# Patient Record
Sex: Female | Born: 1960 | Race: White | Hispanic: No | Marital: Married | Smoking: Never smoker
Health system: Southern US, Community
[De-identification: ages and names within clinical notes are randomized; demographics above are authoritative.]

## PROBLEM LIST (undated history)

## (undated) DIAGNOSIS — I10 Essential (primary) hypertension: Secondary | ICD-10-CM

## (undated) DIAGNOSIS — E785 Hyperlipidemia, unspecified: Secondary | ICD-10-CM

---

## 2010-01-01 ENCOUNTER — Ambulatory Visit: Payer: Self-pay | Admitting: Orthopedic Surgery

## 2010-01-01 DIAGNOSIS — M76829 Posterior tibial tendinitis, unspecified leg: Secondary | ICD-10-CM | POA: Insufficient documentation

## 2010-01-01 DIAGNOSIS — M5430 Sciatica, unspecified side: Secondary | ICD-10-CM | POA: Insufficient documentation

## 2010-01-01 DIAGNOSIS — M7710 Lateral epicondylitis, unspecified elbow: Secondary | ICD-10-CM | POA: Insufficient documentation

## 2010-01-01 DIAGNOSIS — Q762 Congenital spondylolisthesis: Secondary | ICD-10-CM | POA: Insufficient documentation

## 2010-01-01 DIAGNOSIS — M549 Dorsalgia, unspecified: Secondary | ICD-10-CM | POA: Insufficient documentation

## 2010-03-25 NOTE — Letter (Signed)
Summary: History form  History form   Imported By: Jacklynn Ganong 01/01/2010 11:45:55  _____________________________________________________________________  External Attachment:    Type:   Image     Comment:   External Document

## 2010-03-25 NOTE — Assessment & Plan Note (Signed)
Summary: RT ELBOW PAIN NEEDS XR/BCBS/BSF   Vital Signs:  Patient profile:   50 year old female Height:      65 inches Weight:      226 pounds Pulse rate:   68 / minute Resp:     16 per minute  Vitals Entered By: Fuller Canada MD (January 01, 2010 10:05 AM)  Visit Type:  new patient Referring Provider:  self Primary Provider:  Dr. Donzetta Sprung  CC:  right elbow.  History of Present Illness: I saw Paula Jones in the office today for an initial visit.  She is a 50 years old woman with the complaint of:  increasing pain in her lumbar spine x1 month, which radiates to her LEFT foot. She describes a sharp, stabbing severe, 7/10 pain, and the LEFT lower extremity, which is intermittent and worse in the morning. It seems to be improved by stretching, and ice it is incompletely relieved by Advil 600 mg at night. No specific worsening factors are noted.   Are there any red flags:  Numbness, tingling ? no  Bowel/bladder problem? no Saddle anesthesisa? no  Fever, Night sweats? no  Weakness? no   Xrays today in our office.  Meds: Losartan and Advil as needed.      Allergies (verified): No Known Drug Allergies  Past History:  Past Medical History: htn melanoma  Past Surgical History: removal of melanoma tonsils rt knee sark  Family History: FH of Cancer:  Family History of Arthritis Hx, family, asthma  Social History: Patient is married.  bakery no smoking no alcohol no caffeien 4 yr college degree  Review of Systems Musculoskeletal:  Complains of joint pain, swelling, and stiffness; denies instability, redness, heat, and muscle pain.  The review of systems is negative for Constitutional, Cardiovascular, Respiratory, Gastrointestinal, Genitourinary, Neurologic, Endocrine, Psychiatric, Skin, HEENT, Immunology, and Hemoatologic.  Physical Exam  Msk:  The patient is well developed and nourished, with normal grooming and hygiene. The body habitus is  large  Lumbar spine nontender. LEFT buttock area is tender.    Pulses:  pulses normal in all 4 extremities Extremities:  normal range of motion in the RIGHT elbow with positive wrist extension sign with the elbow extended.  RIGHT elbow. Stability is normal. Extension power flexion power are normal.  RIGHT foot. Tenderness over the posterior tibial tendon with normal range of motion at the ankle. No instability detected. Flattened arch noted.  Range of motion of the hips, normal. No tenderness in the hips. Hips are stable. Muscle strength and tone normal.   Neurologic:  no focal deficits, CN II-XII grossly intact with normal reflexes, coordination, muscle strength and tone  She does have a positive LEFT straight leg raise exacerbated with a positive Lasegue's sign. Negative cross leg Lasegue's sign. Skin:  intact without lesions or rashes  She is a RIGHT 4th toe, nail abnormality, as well as a LEFT great toenail and without abnormality, which looked like onychomycosis.   Inguinal Nodes:  no significant adenopathy Psych:  alert and cooperative; normal mood and affect; normal attention span and concentration   Impression & Recommendations:  Problem # 1:  BACK PAIN (ICD-724.5) Assessment New  AP, lateral, RIGHT elbow shows that there is some mild arthritis on the medial side of the elbow with an impinging area at the most medial margin of the joint. Overall, the alignment looks normal.  AP, lateral, and spot films lumbo-sacral spine shows that there is facet arthritis in the last vertebral segment,  L5, S1 and also at L4 and L5. Overall spinal alignment shows a small shift in the coronal plane with normal lordotic curve on the lateral view. There are some anterior marginal osteophytes seen at T12, L2 and there is a slight L4-L5 spondylolisthesis grade 1.  Orders: New Patient Level IV (04540)  Problem # 2:  SCIATICA (ICD-724.3) Assessment: New  Orders: New Patient Level IV  (98119)  Problem # 3:  SPONDYLOLITHESIS (JYN-829.56) Assessment: New  Orders: New Patient Level IV (21308) Lumbosacral Spine ,2/3 views (72100)  Problem # 4:  LATERAL EPICONDYLITIS (ICD-726.32) Assessment: New  AP lateral x-rays of the elbow, show mild arthritis. Clinical exam shows tendinitis, consistent with lateral epicondylitis recommend elbow brace, and exercises  Orders: New Patient Level IV (65784) Elbow x-ray, 2 views (69629)  Problem # 5:  TIBIALIS TENDINITIS (ICD-726.72) Assessment: New  grade 1 posterior tibial tendon dysfunction, recommend over-the-counter Warm'N'Form orthotics.  Orders: New Patient Level IV (52841)  Medications Added to Medication List This Visit: 1)  Prednisone (pak) 10 Mg Tabs (Prednisone) .... As directed  Patient Instructions: 1)  After dose pack  2)  Start Ibuprofen 600mg  three times a day for 4 weeks  3)  Most patients (90%) of patients with low back pain will improve with time (2-6 weeks). Limit activity to comfort and avoid activities that increase discomfort.  Apply moist heat and/or ice to lower back and take medication as instructed for pain relief. Please read the Back Pain Handout. Avoid bed rest > 48hrs.  4)  DX: arthritis of the back with sciatica  Prescriptions: PREDNISONE (PAK) 10 MG TABS (PREDNISONE) as directed  #1 x 1   Entered and Authorized by:   Fuller Canada MD   Signed by:   Fuller Canada MD on 01/01/2010   Method used:   Print then Give to Patient   RxID:   [id number]    Orders Added: 1)  New Patient Level IV [03474] 2)  Lumbosacral Spine ,2/3 views [72100] 3)  Elbow x-ray, 2 views [73070]

## 2010-03-25 NOTE — Medication Information (Signed)
Summary: Tax adviser   Imported By: Cammie Sickle 01/07/2010 18:49:19  _____________________________________________________________________  External Attachment:    Type:   Image     Comment:   External Document

## 2010-09-17 ENCOUNTER — Emergency Department (HOSPITAL_COMMUNITY): Payer: No Typology Code available for payment source

## 2010-09-17 ENCOUNTER — Emergency Department (HOSPITAL_COMMUNITY)
Admission: EM | Admit: 2010-09-17 | Discharge: 2010-09-18 | Disposition: A | Discharge status: Home / Self Care | Payer: No Typology Code available for payment source | Attending: Emergency Medicine | Admitting: Emergency Medicine

## 2010-09-17 DIAGNOSIS — R51 Headache: Secondary | ICD-10-CM | POA: Insufficient documentation

## 2010-09-17 DIAGNOSIS — I10 Essential (primary) hypertension: Secondary | ICD-10-CM | POA: Insufficient documentation

## 2010-09-17 DIAGNOSIS — M542 Cervicalgia: Secondary | ICD-10-CM | POA: Insufficient documentation

## 2012-03-14 ENCOUNTER — Emergency Department (HOSPITAL_COMMUNITY): Payer: BC Managed Care – PPO

## 2012-03-14 ENCOUNTER — Encounter (HOSPITAL_COMMUNITY): Payer: Self-pay | Admitting: Family Medicine

## 2012-03-14 ENCOUNTER — Emergency Department (HOSPITAL_COMMUNITY)
Admission: EM | Admit: 2012-03-14 | Discharge: 2012-03-14 | Disposition: A | Discharge status: Home / Self Care | Payer: BC Managed Care – PPO | Attending: Emergency Medicine | Admitting: Emergency Medicine

## 2012-03-14 DIAGNOSIS — S6980XA Other specified injuries of unspecified wrist, hand and finger(s), initial encounter: Secondary | ICD-10-CM | POA: Insufficient documentation

## 2012-03-14 DIAGNOSIS — Y939 Activity, unspecified: Secondary | ICD-10-CM | POA: Insufficient documentation

## 2012-03-14 DIAGNOSIS — Z79899 Other long term (current) drug therapy: Secondary | ICD-10-CM | POA: Insufficient documentation

## 2012-03-14 DIAGNOSIS — F411 Generalized anxiety disorder: Secondary | ICD-10-CM | POA: Insufficient documentation

## 2012-03-14 DIAGNOSIS — I10 Essential (primary) hypertension: Secondary | ICD-10-CM | POA: Insufficient documentation

## 2012-03-14 DIAGNOSIS — S6990XA Unspecified injury of unspecified wrist, hand and finger(s), initial encounter: Secondary | ICD-10-CM | POA: Insufficient documentation

## 2012-03-14 DIAGNOSIS — S60229A Contusion of unspecified hand, initial encounter: Secondary | ICD-10-CM

## 2012-03-14 DIAGNOSIS — S2239XA Fracture of one rib, unspecified side, initial encounter for closed fracture: Secondary | ICD-10-CM

## 2012-03-14 DIAGNOSIS — S2249XA Multiple fractures of ribs, unspecified side, initial encounter for closed fracture: Secondary | ICD-10-CM

## 2012-03-14 DIAGNOSIS — Y92410 Unspecified street and highway as the place of occurrence of the external cause: Secondary | ICD-10-CM | POA: Insufficient documentation

## 2012-03-14 HISTORY — DX: Essential (primary) hypertension: I10

## 2012-03-14 MED ORDER — OXYCODONE-ACETAMINOPHEN 5-325 MG PO TABS: 2.0000 | ORAL_TABLET | ORAL | Status: AC | PRN

## 2012-03-14 MED ORDER — IBUPROFEN 600 MG PO TABS: 600.0000 mg | ORAL_TABLET | Freq: Four times a day (QID) | ORAL | Status: AC | PRN

## 2012-03-14 MED ORDER — IBUPROFEN 400 MG PO TABS
600.0000 mg | ORAL_TABLET | Freq: Once | ORAL | Status: AC
  Administered 2012-03-14: 600 mg via ORAL
  Filled 2012-03-14: qty 2

## 2012-03-14 NOTE — ED Notes (Signed)
Patient transported to X-ray 
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
## 2012-03-14 NOTE — ED Notes (Signed)
NAD noted at time of d/c. Pt verbalized and demonstrated use of IS.
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
## 2018-03-09 DIAGNOSIS — G6289 Other specified polyneuropathies: Secondary | ICD-10-CM | POA: Diagnosis not present

## 2018-03-09 DIAGNOSIS — E782 Mixed hyperlipidemia: Secondary | ICD-10-CM | POA: Diagnosis not present

## 2018-03-09 DIAGNOSIS — J4520 Mild intermittent asthma, uncomplicated: Secondary | ICD-10-CM | POA: Diagnosis not present

## 2018-03-09 DIAGNOSIS — I10 Essential (primary) hypertension: Secondary | ICD-10-CM | POA: Diagnosis not present

## 2018-08-29 DIAGNOSIS — Z01419 Encounter for gynecological examination (general) (routine) without abnormal findings: Secondary | ICD-10-CM | POA: Diagnosis not present

## 2018-09-02 DIAGNOSIS — I10 Essential (primary) hypertension: Secondary | ICD-10-CM | POA: Diagnosis not present

## 2018-09-02 DIAGNOSIS — Z23 Encounter for immunization: Secondary | ICD-10-CM | POA: Diagnosis not present

## 2018-09-02 DIAGNOSIS — E782 Mixed hyperlipidemia: Secondary | ICD-10-CM | POA: Diagnosis not present

## 2018-09-02 DIAGNOSIS — J4520 Mild intermittent asthma, uncomplicated: Secondary | ICD-10-CM | POA: Diagnosis not present

## 2018-09-02 DIAGNOSIS — G6289 Other specified polyneuropathies: Secondary | ICD-10-CM | POA: Diagnosis not present

## 2018-09-19 DIAGNOSIS — Z01419 Encounter for gynecological examination (general) (routine) without abnormal findings: Secondary | ICD-10-CM | POA: Diagnosis not present

## 2018-09-19 DIAGNOSIS — Z01411 Encounter for gynecological examination (general) (routine) with abnormal findings: Secondary | ICD-10-CM | POA: Diagnosis not present

## 2018-10-18 DIAGNOSIS — R946 Abnormal results of thyroid function studies: Secondary | ICD-10-CM | POA: Diagnosis not present

## 2018-10-20 DIAGNOSIS — Z6837 Body mass index (BMI) 37.0-37.9, adult: Secondary | ICD-10-CM | POA: Diagnosis not present

## 2018-10-20 DIAGNOSIS — L509 Urticaria, unspecified: Secondary | ICD-10-CM | POA: Diagnosis not present

## 2018-10-20 DIAGNOSIS — G4710 Hypersomnia, unspecified: Secondary | ICD-10-CM | POA: Diagnosis not present

## 2018-10-27 DIAGNOSIS — Z1231 Encounter for screening mammogram for malignant neoplasm of breast: Secondary | ICD-10-CM | POA: Diagnosis not present

## 2019-09-04 ENCOUNTER — Other Ambulatory Visit (HOSPITAL_COMMUNITY): Payer: Self-pay | Admitting: Family Medicine

## 2019-09-04 DIAGNOSIS — Z1231 Encounter for screening mammogram for malignant neoplasm of breast: Secondary | ICD-10-CM

## 2019-11-03 ENCOUNTER — Ambulatory Visit (HOSPITAL_COMMUNITY): Payer: Self-pay

## 2019-11-13 ENCOUNTER — Ambulatory Visit (HOSPITAL_COMMUNITY)
Admission: RE | Admit: 2019-11-13 | Discharge: 2019-11-13 | Disposition: A | Discharge status: Home / Self Care | Payer: 59 | Source: Ambulatory Visit | Attending: Family Medicine | Admitting: Family Medicine

## 2019-11-13 ENCOUNTER — Other Ambulatory Visit: Payer: Self-pay

## 2019-11-13 DIAGNOSIS — Z1231 Encounter for screening mammogram for malignant neoplasm of breast: Secondary | ICD-10-CM | POA: Diagnosis present

## 2020-09-19 ENCOUNTER — Other Ambulatory Visit (HOSPITAL_COMMUNITY): Payer: Self-pay | Admitting: Family Medicine

## 2020-09-19 DIAGNOSIS — Z1231 Encounter for screening mammogram for malignant neoplasm of breast: Secondary | ICD-10-CM

## 2020-11-14 ENCOUNTER — Ambulatory Visit (HOSPITAL_COMMUNITY): Payer: 59

## 2020-12-02 ENCOUNTER — Inpatient Hospital Stay (HOSPITAL_COMMUNITY): Admission: RE | Admit: 2020-12-02 | Payer: 59 | Source: Ambulatory Visit

## 2021-01-31 ENCOUNTER — Ambulatory Visit (HOSPITAL_COMMUNITY)
Admission: RE | Admit: 2021-01-31 | Discharge: 2021-01-31 | Disposition: A | Discharge status: Home / Self Care | Payer: 59 | Source: Ambulatory Visit | Attending: Family Medicine | Admitting: Family Medicine

## 2021-01-31 ENCOUNTER — Other Ambulatory Visit: Payer: Self-pay

## 2021-01-31 DIAGNOSIS — Z1231 Encounter for screening mammogram for malignant neoplasm of breast: Secondary | ICD-10-CM | POA: Diagnosis present

## 2021-04-10 ENCOUNTER — Encounter (INDEPENDENT_AMBULATORY_CARE_PROVIDER_SITE_OTHER): Payer: Self-pay | Admitting: *Deleted

## 2021-04-24 ENCOUNTER — Other Ambulatory Visit: Payer: Self-pay | Admitting: *Deleted

## 2021-04-24 DIAGNOSIS — Z800 Family history of malignant neoplasm of digestive organs: Secondary | ICD-10-CM

## 2021-04-24 DIAGNOSIS — Z1211 Encounter for screening for malignant neoplasm of colon: Secondary | ICD-10-CM

## 2021-04-30 ENCOUNTER — Ambulatory Visit: Payer: 59 | Admitting: General Surgery

## 2021-05-20 ENCOUNTER — Encounter (INDEPENDENT_AMBULATORY_CARE_PROVIDER_SITE_OTHER): Payer: Self-pay

## 2021-06-19 ENCOUNTER — Telehealth (INDEPENDENT_AMBULATORY_CARE_PROVIDER_SITE_OTHER): Payer: Self-pay

## 2021-06-19 NOTE — Telephone Encounter (Signed)
Referring MD/PCP: Quillian Quince ? ?Procedure: Tcs ? ?Reason/Indication:  Fam hx of colon ca ? ?Has patient had this procedure before?  yes ? If so, when, by whom and where?  12/2015 ? ?Is there a family history of colon cancer?  no ? Who?  What age when diagnosed?   ? ?Is patient diabetic? If yes, Type 1 or Type 2   no ?     ?Does patient have prosthetic heart valve or mechanical valve?  no ? ?Do you have a pacemaker/defibrillator?  no ? ?Has patient ever had endocarditis/atrial fibrillation? no ? ?Does patient use oxygen? no ? ?Has patient had joint replacement within last 12 months?  no ? ?Is patient constipated or do they take laxatives? no ? ?Does patient have a history of alcohol/drug use?  no ? ?Have you had a stroke/heart attack last 6 mths? no ? ?Do you take medicine for weight loss?  no ? ?For female patients,: have you had a hysterectomy no ?                     are you post menopausal yes ?                     do you still have your menstrual cycle no ? ?Is patient on blood thinner such as Coumadin, Plavix and/or Aspirin? no ? ?Medications: atorvastatin 10 mg daily, losartan 50 mg daily ? ?Allergies: nkda ? ?Medication Adjustment per Dr Jenetta Downer none ? ?Procedure date & time:  ? ? ?

## 2021-07-01 ENCOUNTER — Other Ambulatory Visit (INDEPENDENT_AMBULATORY_CARE_PROVIDER_SITE_OTHER): Payer: Self-pay

## 2021-07-01 DIAGNOSIS — Z800 Family history of malignant neoplasm of digestive organs: Secondary | ICD-10-CM

## 2021-07-01 DIAGNOSIS — Z1211 Encounter for screening for malignant neoplasm of colon: Secondary | ICD-10-CM

## 2021-07-02 ENCOUNTER — Telehealth (INDEPENDENT_AMBULATORY_CARE_PROVIDER_SITE_OTHER): Payer: Self-pay

## 2021-07-02 MED ORDER — SUTAB 1479-225-188 MG PO TABS: 1.0000 | ORAL_TABLET | Freq: Once | ORAL | 0 refills | Status: AC

## 2021-07-02 NOTE — Telephone Encounter (Signed)
Kelleen Stolze Ann Orbin Mayeux, CMA  ?

## 2021-07-03 ENCOUNTER — Encounter (INDEPENDENT_AMBULATORY_CARE_PROVIDER_SITE_OTHER): Payer: Self-pay

## 2021-07-11 ENCOUNTER — Encounter (HOSPITAL_COMMUNITY): Admission: RE | Admit: 2021-07-11 | Payer: 59 | Source: Ambulatory Visit

## 2021-08-13 ENCOUNTER — Encounter (HOSPITAL_COMMUNITY): Payer: Self-pay

## 2021-08-13 ENCOUNTER — Encounter (HOSPITAL_COMMUNITY)
Admission: RE | Admit: 2021-08-13 | Discharge: 2021-08-13 | Disposition: A | Discharge status: Home / Self Care | Payer: 59 | Source: Ambulatory Visit | Attending: Gastroenterology | Admitting: Gastroenterology

## 2021-08-13 HISTORY — DX: Hyperlipidemia, unspecified: E78.5

## 2021-08-15 ENCOUNTER — Ambulatory Visit (HOSPITAL_COMMUNITY): Payer: 59 | Admitting: Anesthesiology

## 2021-08-15 ENCOUNTER — Encounter (HOSPITAL_COMMUNITY)
Admission: RE | Disposition: A | Discharge status: Home / Self Care | Payer: Self-pay | Source: Ambulatory Visit | Attending: Gastroenterology

## 2021-08-15 ENCOUNTER — Ambulatory Visit (HOSPITAL_COMMUNITY)
Admission: RE | Admit: 2021-08-15 | Discharge: 2021-08-15 | Disposition: A | Discharge status: Home / Self Care | Payer: 59 | Source: Ambulatory Visit | Attending: Gastroenterology | Admitting: Gastroenterology

## 2021-08-15 ENCOUNTER — Ambulatory Visit (HOSPITAL_BASED_OUTPATIENT_CLINIC_OR_DEPARTMENT_OTHER): Payer: 59 | Admitting: Anesthesiology

## 2021-08-15 DIAGNOSIS — D120 Benign neoplasm of cecum: Secondary | ICD-10-CM | POA: Insufficient documentation

## 2021-08-15 DIAGNOSIS — I10 Essential (primary) hypertension: Secondary | ICD-10-CM | POA: Diagnosis not present

## 2021-08-15 DIAGNOSIS — Z800 Family history of malignant neoplasm of digestive organs: Secondary | ICD-10-CM | POA: Insufficient documentation

## 2021-08-15 DIAGNOSIS — K648 Other hemorrhoids: Secondary | ICD-10-CM | POA: Diagnosis not present

## 2021-08-15 DIAGNOSIS — K635 Polyp of colon: Secondary | ICD-10-CM | POA: Diagnosis not present

## 2021-08-15 DIAGNOSIS — Z1211 Encounter for screening for malignant neoplasm of colon: Secondary | ICD-10-CM | POA: Insufficient documentation

## 2021-08-15 DIAGNOSIS — E785 Hyperlipidemia, unspecified: Secondary | ICD-10-CM | POA: Diagnosis not present

## 2021-08-15 HISTORY — PX: COLONOSCOPY WITH PROPOFOL: SHX5780

## 2021-08-15 HISTORY — PX: POLYPECTOMY: SHX5525

## 2021-08-15 LAB — HM COLONOSCOPY

## 2021-08-15 SURGERY — COLONOSCOPY WITH PROPOFOL
Anesthesia: General

## 2021-08-15 MED ORDER — LIDOCAINE 2% (20 MG/ML) 5 ML SYRINGE
INTRAMUSCULAR | Status: DC | PRN
  Administered 2021-08-15: 60 mg via INTRAVENOUS

## 2021-08-15 MED ORDER — STERILE WATER FOR IRRIGATION IR SOLN
Status: DC | PRN
  Administered 2021-08-15: 50 mL

## 2021-08-15 MED ORDER — LACTATED RINGERS IV SOLN: INTRAVENOUS | Status: DC

## 2021-08-15 MED ORDER — PROPOFOL 10 MG/ML IV BOLUS
INTRAVENOUS | Status: DC | PRN
  Administered 2021-08-15: 100 mg via INTRAVENOUS
  Administered 2021-08-15: 30 mg via INTRAVENOUS

## 2021-08-15 MED ORDER — PROPOFOL 500 MG/50ML IV EMUL
INTRAVENOUS | Status: DC | PRN
  Administered 2021-08-15: 100 ug/kg/min via INTRAVENOUS

## 2021-08-15 NOTE — Anesthesia Preprocedure Evaluation (Addendum)
Anesthesia Evaluation  Patient identified by MRN, date of birth, ID band Patient awake    Reviewed: Allergy & Precautions, NPO status , Patient's Chart, lab work & pertinent test results  Airway Mallampati: II  TM Distance: >3 FB Neck ROM: Full    Dental  (+) Dental Advisory Given, Teeth Intact   Pulmonary neg pulmonary ROS,    Pulmonary exam normal breath sounds clear to auscultation       Cardiovascular Exercise Tolerance: Good hypertension, Pt. on medications Normal cardiovascular exam Rhythm:Regular Rate:Normal     Neuro/Psych  Neuromuscular disease negative psych ROS   GI/Hepatic negative GI ROS, Neg liver ROS,   Endo/Other  negative endocrine ROS  Renal/GU negative Renal ROS  negative genitourinary   Musculoskeletal  (+) Arthritis , Osteoarthritis,    Abdominal   Peds negative pediatric ROS (+)  Hematology negative hematology ROS (+)   Anesthesia Other Findings   Reproductive/Obstetrics negative OB ROS                            Anesthesia Physical Anesthesia Plan  ASA: 2  Anesthesia Plan: General   Post-op Pain Management: Minimal or no pain anticipated   Induction: Intravenous  PONV Risk Score and Plan: Propofol infusion  Airway Management Planned: Nasal Cannula and Natural Airway  Additional Equipment:   Intra-op Plan:   Post-operative Plan:   Informed Consent: I have reviewed the patients History and Physical, chart, labs and discussed the procedure including the risks, benefits and alternatives for the proposed anesthesia with the patient or authorized representative who has indicated his/her understanding and acceptance.     Dental advisory given  Plan Discussed with: CRNA and Surgeon  Anesthesia Plan Comments:         Anesthesia Quick Evaluation

## 2021-08-18 ENCOUNTER — Encounter (INDEPENDENT_AMBULATORY_CARE_PROVIDER_SITE_OTHER): Payer: Self-pay | Admitting: *Deleted

## 2021-08-18 LAB — SURGICAL PATHOLOGY

## 2021-08-21 ENCOUNTER — Encounter (HOSPITAL_COMMUNITY): Payer: Self-pay | Admitting: Gastroenterology

## 2021-10-08 ENCOUNTER — Other Ambulatory Visit (HOSPITAL_COMMUNITY): Payer: Self-pay | Admitting: Family Medicine

## 2021-10-08 DIAGNOSIS — Z1231 Encounter for screening mammogram for malignant neoplasm of breast: Secondary | ICD-10-CM

## 2022-05-11 ENCOUNTER — Ambulatory Visit (HOSPITAL_COMMUNITY)
Admission: RE | Admit: 2022-05-11 | Discharge: 2022-05-11 | Disposition: A | Discharge status: Home / Self Care | Payer: 59 | Source: Ambulatory Visit | Attending: Family Medicine | Admitting: Family Medicine

## 2022-05-11 DIAGNOSIS — Z1231 Encounter for screening mammogram for malignant neoplasm of breast: Secondary | ICD-10-CM | POA: Diagnosis present

## 2023-01-24 IMAGING — MG MM DIGITAL SCREENING BILAT W/ TOMO AND CAD
8 series · 8 of 24 positions shown · non-contrast
Comparison: Previous exam(s).

CLINICAL DATA: Screening.

EXAM:
DIGITAL SCREENING BILATERAL MAMMOGRAM WITH TOMOSYNTHESIS AND CAD
TECHNIQUE: Bilateral screening digital craniocaudal and mediolateral oblique
mammograms were obtained. Bilateral screening digital breast
tomosynthesis was performed. The images were evaluated with
computer-aided detection.

[R MLO synth-2D]
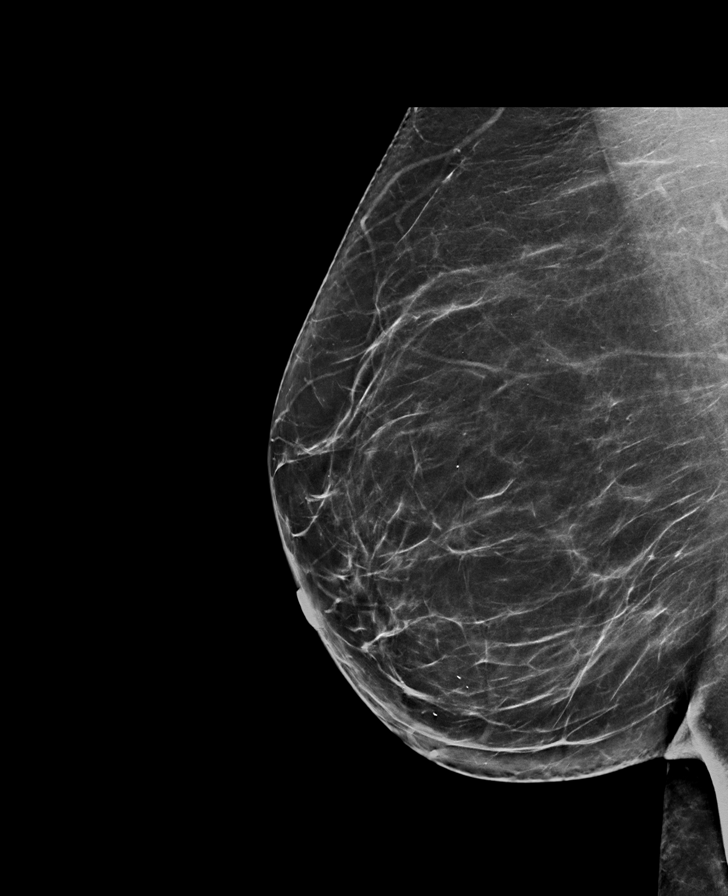

[R CC synth-2D]
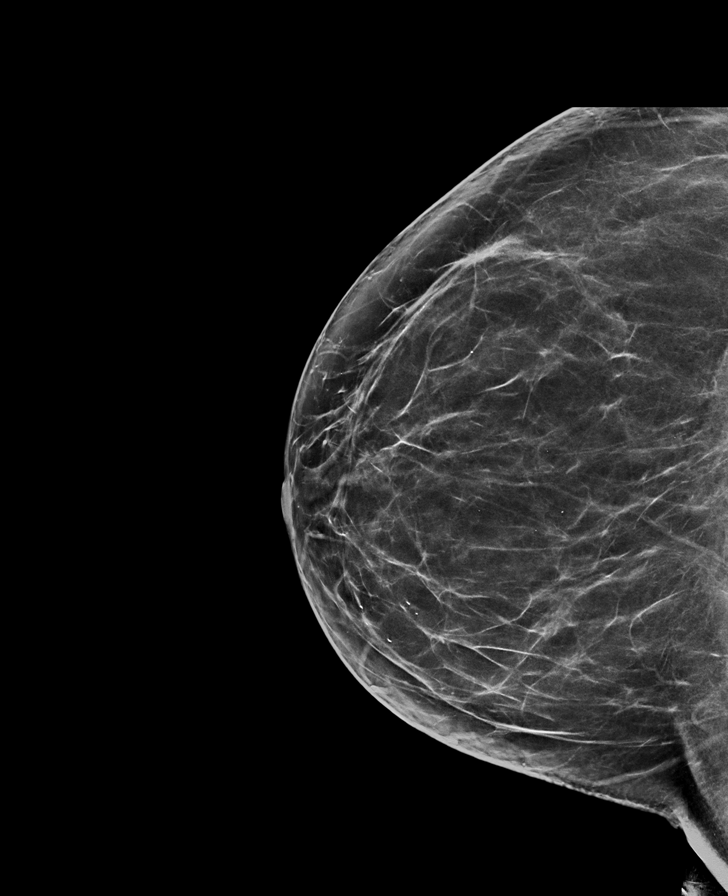

[L MLO synth-2D]
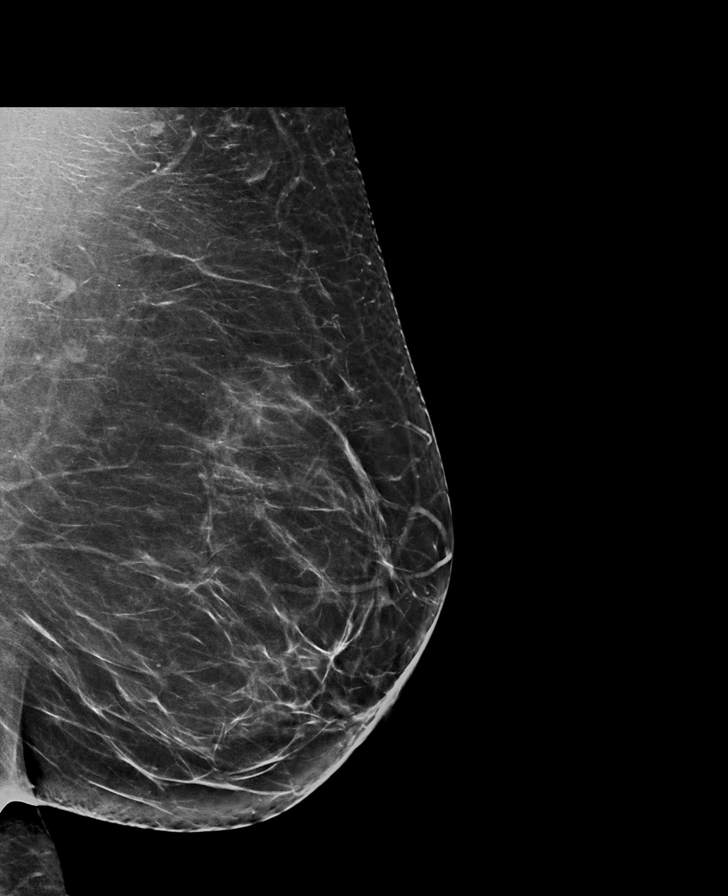

[L CC synth-2D]
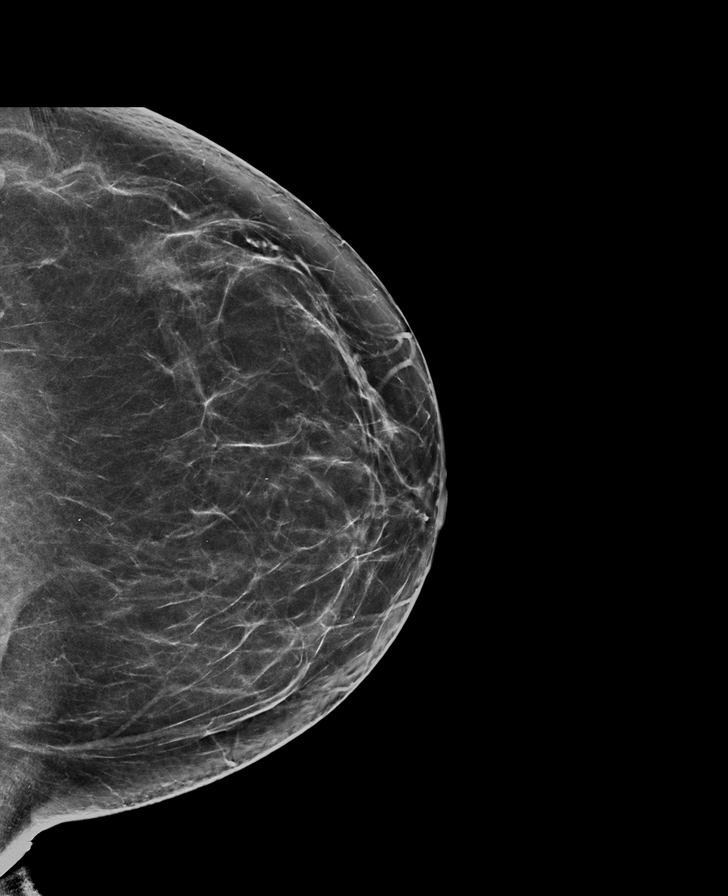

[R MLO tomo · tomo slice 41/80.0]
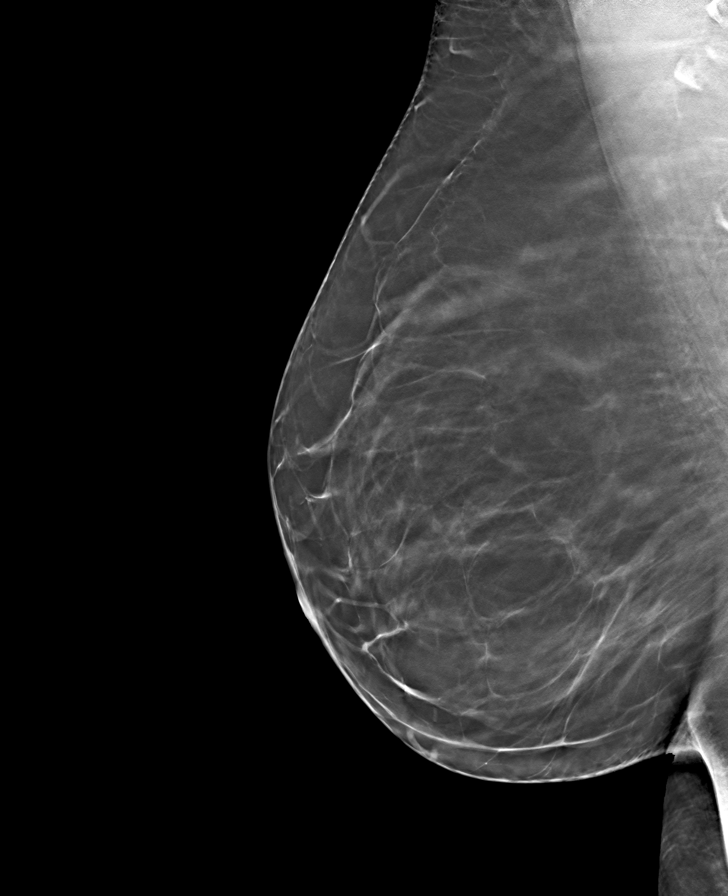

[L CC tomo · tomo slice 41/81.0]
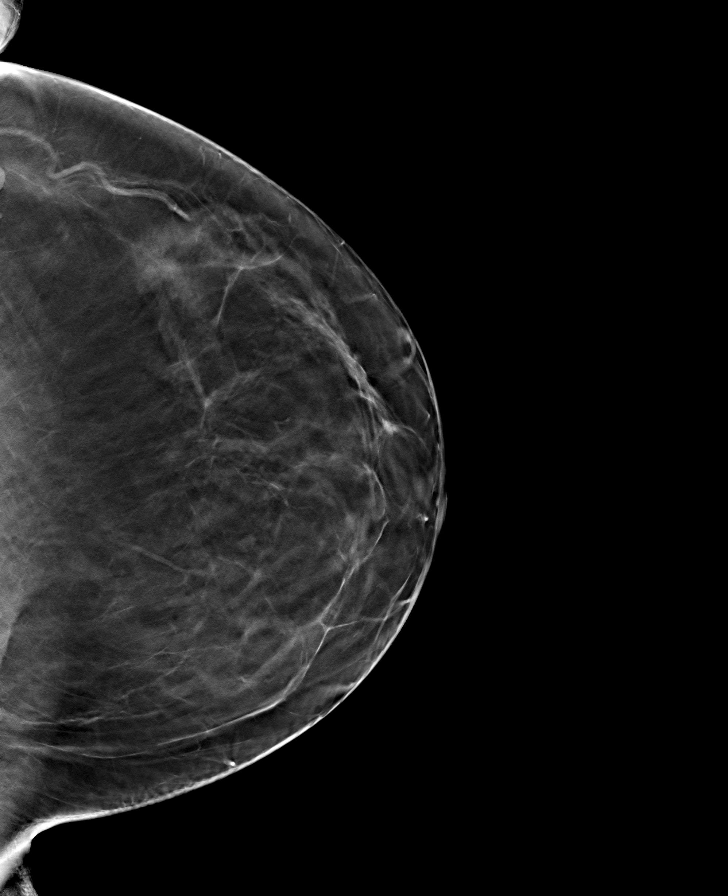

[R CC tomo · tomo slice 40/79.0]
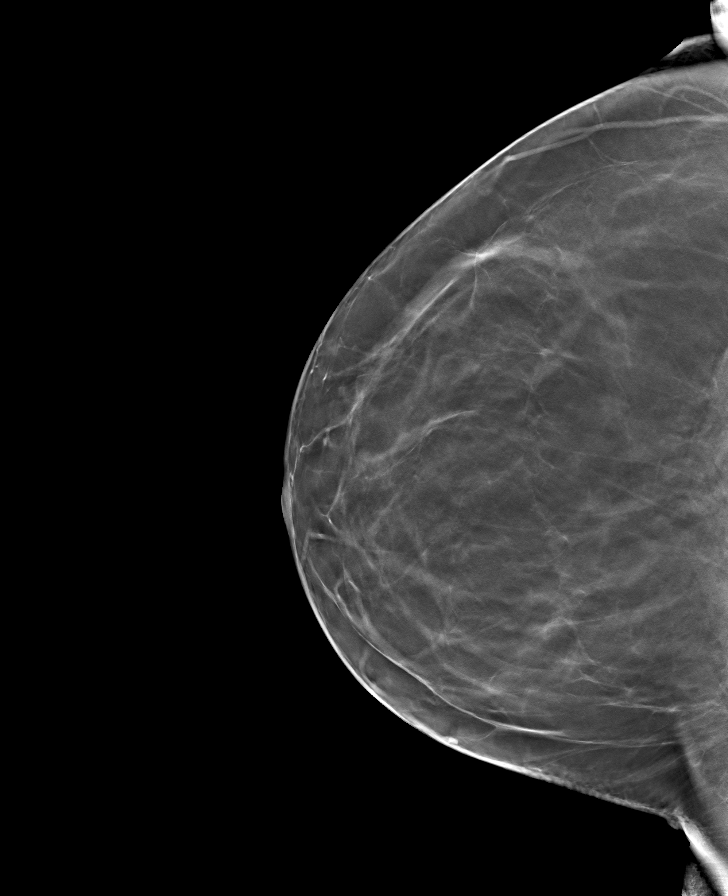

[L MLO tomo · tomo slice 43/85.0]
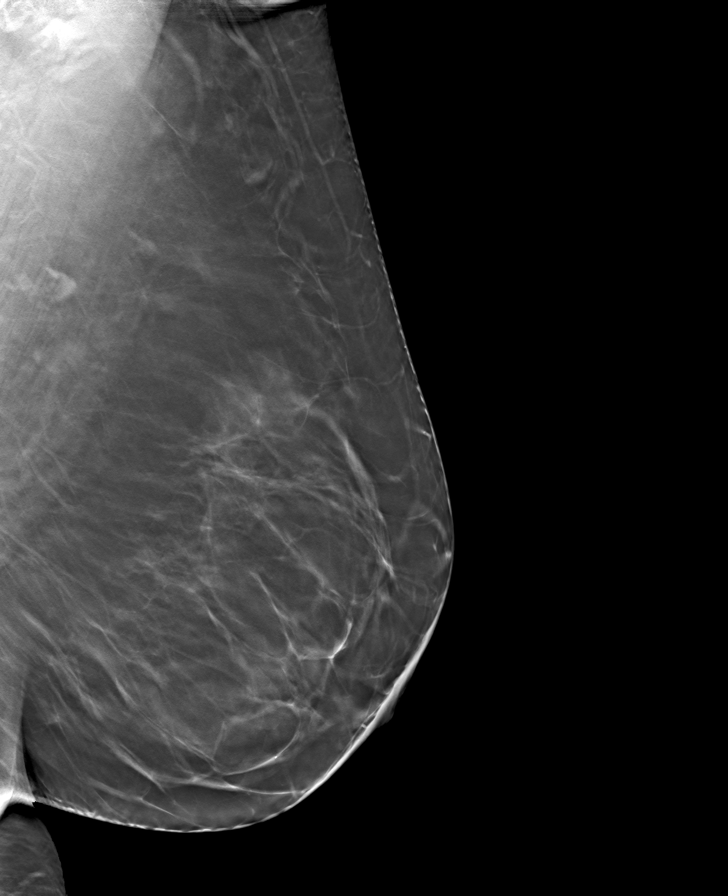

[8 of 24 positions shown; findings below may reference images not displayed]

ACR Breast Density Category b: There are scattered areas of
fibroglandular density.
FINDINGS: There are no findings suspicious for malignancy.
IMPRESSION: No mammographic evidence of malignancy. A result letter of this
screening mammogram will be mailed directly to the patient.

RECOMMENDATION:
Screening mammogram in one year. (Code:51-O-LD2)

BI-RADS CATEGORY  1: Negative.

## 2023-11-10 DIAGNOSIS — E782 Mixed hyperlipidemia: Secondary | ICD-10-CM | POA: Diagnosis not present

## 2023-11-10 DIAGNOSIS — Z6836 Body mass index (BMI) 36.0-36.9, adult: Secondary | ICD-10-CM | POA: Diagnosis not present

## 2023-11-10 DIAGNOSIS — I10 Essential (primary) hypertension: Secondary | ICD-10-CM | POA: Diagnosis not present

## 2023-11-10 DIAGNOSIS — E7849 Other hyperlipidemia: Secondary | ICD-10-CM | POA: Diagnosis not present
